# Patient Record
Sex: Female | Born: 1965 | Race: Black or African American | Hispanic: No | Marital: Single | Smoking: Never smoker
Health system: Southern US, Community
[De-identification: ages and names within clinical notes are randomized; demographics above are authoritative.]

## PROBLEM LIST (undated history)

## (undated) HISTORY — PX: HEEL SPUR SURGERY: SHX665

---

## 1998-02-11 ENCOUNTER — Inpatient Hospital Stay (HOSPITAL_COMMUNITY): Admission: AD | Admit: 1998-02-11 | Discharge: 1998-02-12 | Payer: Self-pay | Admitting: Obstetrics and Gynecology

## 1998-02-19 ENCOUNTER — Inpatient Hospital Stay (HOSPITAL_COMMUNITY): Admission: AD | Admit: 1998-02-19 | Discharge: 1998-02-19 | Payer: Self-pay | Admitting: Obstetrics and Gynecology

## 1998-03-05 ENCOUNTER — Observation Stay (HOSPITAL_COMMUNITY): Admission: AD | Admit: 1998-03-05 | Discharge: 1998-03-05 | Payer: Self-pay | Admitting: Obstetrics and Gynecology

## 1998-03-07 ENCOUNTER — Other Ambulatory Visit: Admission: RE | Admit: 1998-03-07 | Discharge: 1998-03-07 | Payer: Self-pay | Admitting: Obstetrics and Gynecology

## 1998-04-17 ENCOUNTER — Inpatient Hospital Stay (HOSPITAL_COMMUNITY): Admission: AD | Admit: 1998-04-17 | Discharge: 1998-04-17 | Payer: Self-pay | Admitting: Obstetrics and Gynecology

## 1998-07-01 ENCOUNTER — Other Ambulatory Visit: Admission: RE | Admit: 1998-07-01 | Discharge: 1998-07-01 | Payer: Self-pay | Admitting: Obstetrics & Gynecology

## 1998-07-29 ENCOUNTER — Inpatient Hospital Stay (HOSPITAL_COMMUNITY): Admission: AD | Admit: 1998-07-29 | Discharge: 1998-07-29 | Payer: Self-pay | Admitting: Obstetrics & Gynecology

## 1998-09-06 ENCOUNTER — Observation Stay (HOSPITAL_COMMUNITY): Admission: AD | Admit: 1998-09-06 | Discharge: 1998-09-07 | Payer: Self-pay | Admitting: Obstetrics & Gynecology

## 1998-09-11 ENCOUNTER — Inpatient Hospital Stay (HOSPITAL_COMMUNITY): Admission: AD | Admit: 1998-09-11 | Discharge: 1998-09-13 | Payer: Self-pay | Admitting: *Deleted

## 1999-10-01 ENCOUNTER — Other Ambulatory Visit: Admission: RE | Admit: 1999-10-01 | Discharge: 1999-10-01 | Payer: Self-pay | Admitting: Obstetrics and Gynecology

## 2000-10-04 ENCOUNTER — Other Ambulatory Visit: Admission: RE | Admit: 2000-10-04 | Discharge: 2000-10-04 | Payer: Self-pay | Admitting: Obstetrics and Gynecology

## 2000-10-20 ENCOUNTER — Encounter (INDEPENDENT_AMBULATORY_CARE_PROVIDER_SITE_OTHER): Payer: Self-pay | Admitting: Specialist

## 2000-10-20 ENCOUNTER — Other Ambulatory Visit: Admission: RE | Admit: 2000-10-20 | Discharge: 2000-10-20 | Payer: Self-pay | Admitting: Plastic Surgery

## 2001-10-03 ENCOUNTER — Other Ambulatory Visit: Admission: RE | Admit: 2001-10-03 | Discharge: 2001-10-03 | Payer: Self-pay | Admitting: Obstetrics and Gynecology

## 2002-10-12 ENCOUNTER — Other Ambulatory Visit: Admission: RE | Admit: 2002-10-12 | Discharge: 2002-10-12 | Payer: Self-pay | Admitting: Obstetrics and Gynecology

## 2003-10-22 ENCOUNTER — Other Ambulatory Visit: Admission: RE | Admit: 2003-10-22 | Discharge: 2003-10-22 | Payer: Self-pay | Admitting: Obstetrics and Gynecology

## 2003-12-08 HISTORY — PX: BREAST ENHANCEMENT SURGERY: SHX7

## 2004-11-06 ENCOUNTER — Other Ambulatory Visit: Admission: RE | Admit: 2004-11-06 | Discharge: 2004-11-06 | Payer: Self-pay | Admitting: Obstetrics and Gynecology

## 2005-12-02 ENCOUNTER — Other Ambulatory Visit: Admission: RE | Admit: 2005-12-02 | Discharge: 2005-12-02 | Payer: Self-pay | Admitting: Obstetrics and Gynecology

## 2010-05-16 ENCOUNTER — Encounter: Admission: RE | Admit: 2010-05-16 | Discharge: 2010-06-18 | Payer: Self-pay | Admitting: Specialist

## 2012-02-22 ENCOUNTER — Ambulatory Visit (INDEPENDENT_AMBULATORY_CARE_PROVIDER_SITE_OTHER): Payer: BC Managed Care – PPO | Admitting: Obstetrics and Gynecology

## 2012-02-22 DIAGNOSIS — Z01419 Encounter for gynecological examination (general) (routine) without abnormal findings: Secondary | ICD-10-CM

## 2012-04-13 ENCOUNTER — Telehealth: Payer: Self-pay | Admitting: Obstetrics and Gynecology

## 2012-04-13 ENCOUNTER — Telehealth: Payer: Self-pay

## 2012-04-13 NOTE — Telephone Encounter (Signed)
Left message for pt to call back regarding Alyacen Tabs Pak 28's. b/c a Rx request fax from [facility]  was received to our for Alyacen. Pt was seen 02-22-12 for an AEX Dr.Stringer wrote a Rx for Necon 1/35 #3 months for 1 year. Just want to confirm whether or not pt has Rx or not.

## 2012-05-03 NOTE — Telephone Encounter (Signed)
Triage/epic 

## 2012-05-20 ENCOUNTER — Telehealth: Payer: Self-pay

## 2012-05-20 NOTE — Telephone Encounter (Signed)
Rx Alyacen 1/35 Tabs Pak 28's 90 day supply 4 Rf's was called in to [facility]  I tried to fax this Rx twice but it would not go through. Rx was called in successfully.

## 2013-02-13 ENCOUNTER — Emergency Department
Admission: EM | Admit: 2013-02-13 | Discharge: 2013-02-13 | Disposition: A | Discharge status: Home / Self Care | Payer: Self-pay | Source: Home / Self Care | Attending: Family Medicine | Admitting: Family Medicine

## 2013-02-13 ENCOUNTER — Encounter: Payer: Self-pay | Admitting: Emergency Medicine

## 2013-02-13 DIAGNOSIS — J029 Acute pharyngitis, unspecified: Secondary | ICD-10-CM

## 2013-02-13 MED ORDER — AZITHROMYCIN 250 MG PO TABS: ORAL_TABLET | ORAL | Status: DC

## 2013-02-13 MED ORDER — BENZONATATE 200 MG PO CAPS: 200.0000 mg | ORAL_CAPSULE | Freq: Every day | ORAL | Status: DC

## 2013-02-13 NOTE — ED Provider Notes (Signed)
History     CSN: [id number]  Arrival date & time 02/13/13  0912   First MD Initiated Contact with Patient 02/13/13 [phone]      Chief Complaint  Patient presents with  . Sore Throat       HPI Comments: Patient complains of two day history of mild sore throat, headache, fatigue, sinus congestion, myalgias, and chills/sweats.  He denies cough.  The history is provided by the patient.    History reviewed. No pertinent past medical history.  History reviewed. No pertinent past surgical history.  Family History  Problem Relation Age of Onset  . Diabetes Mother     History  Substance Use Topics  . Smoking status: Never Smoker   . Smokeless tobacco: Not on file  . Alcohol Use: No    OB History   Grav Para Term Preterm Abortions TAB SAB Ect Mult Living                  Review of Systems + sore throat No cough No pleuritic pain No wheezing + nasal congestion + post-nasal drainage No sinus pain/pressure No itchy/red eyes No earache No hemoptysis No SOB No fever, + chills No nausea No vomiting No abdominal pain No diarrhea No urinary symptoms No skin rashes + fatigue + mild myalgias + headache Used OTC meds without relief  Allergies  Review of patient's allergies indicates no known allergies.  Home Medications   Current Outpatient Rx  Name  Route  Sig  Dispense  Refill  . azithromycin (ZITHROMAX Z-PAK) 250 MG tablet      Take 2 tabs today; then begin one tab once daily for 4 more days. (Rx void after 02/21/13)   6 each   0   . benzonatate (TESSALON) 200 MG capsule   Oral   Take 1 capsule (200 mg total) by mouth at bedtime.   12 capsule   0     BP 109/75  Pulse 85  Temp(Src) 98.2 F (36.8 C) (Oral)  Ht 5\' 6"  (1.676 m)  Wt 179 lb (81.194 kg)  BMI 28.91 kg/m2  SpO2 99%  Physical Exam Nursing notes and Vital Signs reviewed. Appearance:  Patient appears healthy, stated age, and in no acute distress Eyes:  Pupils are equal, round, and  reactive to light and accomodation.  Extraocular movement is intact.  Conjunctivae are not inflamed  Ears:  Canals normal.  Tympanic membranes normal.  Nose:  Mildly congested turbinates.  No sinus tenderness.  Pharynx:  Mildly erythematous Neck:  Supple.  Slightly tender shotty anterior/posterior nodes are palpated bilaterally  Lungs:  Clear to auscultation.  Breath sounds are equal.  Heart:  Regular rate and rhythm without murmurs, rubs, or gallops Chest:   Mild tenderness to palpation over the mid-sternum.  Abdomen:  Nontender without masses or hepatosplenomegaly.  Bowel sounds are present.  No CVA or flank tenderness.  Extremities:  No edema.  No calf tenderness Skin:  No rash present.   ED Course  Procedures  none  Labs Reviewed  STREP A DNA PROBE pending  POCT RAPID STREP A (OFFICE) negative      1. Acute pharyngitis   2. Acute upper respiratory infections of unspecified site; suspect viral URI       MDM  There is no evidence of bacterial infection today.   Throat culture pending. Prescription written for Benzonatate [facility]) to take at bedtime for night-time cough.  Take plain Robitussin (guaifenesin) for congestion and cough.  May add  Sudafed for sinus congestion.  Increase fluid intake, rest. May use Afrin nasal spray (or generic oxymetazoline) twice daily for about 5 days.  Also recommend using saline nasal spray several times daily and saline nasal irrigation (AYR is a common brand) Stop all antihistamines for now, and other non-prescription cough/cold preparations. May take Ibuprofen 200mg , 4 tabs every 8 hours with food for sore throat, chest discomfort, etc. Begin Azithromycin if throat culture positive, if not improving about one week, or if persistent fever develops (Given a prescription to hold, with an expiration date)  Follow-up with family doctor if not improving about10 days.         Lattie Haw, MD 02/13/13 [phone]

## 2013-02-13 NOTE — ED Notes (Signed)
Sore throat, body aches, fever, chills x 3 days

## 2013-02-14 ENCOUNTER — Telehealth: Payer: Self-pay | Admitting: *Deleted

## 2014-11-12 ENCOUNTER — Other Ambulatory Visit: Payer: Self-pay | Admitting: Obstetrics and Gynecology

## 2014-11-15 ENCOUNTER — Encounter (HOSPITAL_BASED_OUTPATIENT_CLINIC_OR_DEPARTMENT_OTHER): Payer: Self-pay | Admitting: *Deleted

## 2014-11-15 NOTE — Progress Notes (Signed)
NPO AFTER MN WITH EXCEPTION CLEAR LIQUIDS UNTIL 0730 (NO CREAM/ MILK PRODUCTS).  ARRIVE AT 1200. NEEDS HG AND URINE PREG.  

## 2014-11-19 NOTE — H&P (Signed)
Admission History and Physical Exam for a Gynecology Patient  [name] is a 48 y.o. female who presents for bilateral salpingectomies via laparoscopy for sterilization. [facility].   History reviewed. No pertinent past medical history.  No prescriptions prior to admission    Past Surgical History  Procedure Laterality Date  . Breast enhancement surgery Bilateral 2005  . Heel spur surgery Bilateral last one  2012 left    w/ bunionectomy    Allergies  Allergen Reactions  . Erythromycin Other (See Comments)    GI UPSET    Family History: family history includes Diabetes in her mother.  Social History:  reports that she has never smoked. She has never used smokeless tobacco. She reports that she does not drink alcohol or use illicit drugs.  Review of systems: See HPI.  Admission Physical Exam:    Body mass index is 28.19 kg/(m^2).  Height 5\' 7"  (1.702 m), weight 180 lb (81.647 kg), last menstrual period 11/07/2014.  HEENT:                 Within normal limits Chest:                   Clear Heart:                    Regular rate and rhythm Breasts:                No masses, skin changes, bleeding, or discharge present Abdomen:             Nontender, no masses Extremities:          Grossly normal Neurologic exam: Grossly normal  Pelvic exam:  External genitalia: normal general appearance Vaginal: normal without tenderness, induration or masses Cervix: normal appearance Adnexa: normal bimanual exam Uterus: normal size shape and consistency Rectal: no masses  Assessment:  Desires sterilization  Plan:  The patient will undergo laparoscopic bilateral salpingectomies for sterilization.  She understands the indications for her surgical procedure as well as the alternative treatment options.  She accepts the risk of, but not limited to, anesthetic application,  bleeding, infections, and possible damage to the surrounding organs.   Megan Villegas,Megan Villegas 11/19/2014

## 2014-11-20 ENCOUNTER — Ambulatory Visit (HOSPITAL_BASED_OUTPATIENT_CLINIC_OR_DEPARTMENT_OTHER): Payer: BC Managed Care – PPO | Admitting: Anesthesiology

## 2014-11-20 ENCOUNTER — Ambulatory Visit (HOSPITAL_COMMUNITY)
Admission: RE | Admit: 2014-11-20 | Payer: BC Managed Care – PPO | Source: Ambulatory Visit | Admitting: Obstetrics and Gynecology

## 2014-11-20 ENCOUNTER — Encounter (HOSPITAL_BASED_OUTPATIENT_CLINIC_OR_DEPARTMENT_OTHER): Payer: Self-pay | Admitting: *Deleted

## 2014-11-20 ENCOUNTER — Ambulatory Visit (HOSPITAL_BASED_OUTPATIENT_CLINIC_OR_DEPARTMENT_OTHER)
Admission: RE | Admit: 2014-11-20 | Discharge: 2014-11-20 | Disposition: A | Discharge status: Home / Self Care | Payer: BC Managed Care – PPO | Source: Ambulatory Visit | Attending: Obstetrics and Gynecology | Admitting: Obstetrics and Gynecology

## 2014-11-20 ENCOUNTER — Encounter (HOSPITAL_BASED_OUTPATIENT_CLINIC_OR_DEPARTMENT_OTHER)
Admission: RE | Disposition: A | Discharge status: Home / Self Care | Payer: Self-pay | Source: Ambulatory Visit | Attending: Obstetrics and Gynecology

## 2014-11-20 ENCOUNTER — Encounter (HOSPITAL_COMMUNITY): Admission: RE | Payer: Self-pay | Source: Ambulatory Visit

## 2014-11-20 DIAGNOSIS — Z6828 Body mass index (BMI) 28.0-28.9, adult: Secondary | ICD-10-CM | POA: Diagnosis not present

## 2014-11-20 DIAGNOSIS — Z302 Encounter for sterilization: Secondary | ICD-10-CM | POA: Insufficient documentation

## 2014-11-20 DIAGNOSIS — D649 Anemia, unspecified: Secondary | ICD-10-CM | POA: Diagnosis not present

## 2014-11-20 DIAGNOSIS — E669 Obesity, unspecified: Secondary | ICD-10-CM | POA: Diagnosis not present

## 2014-11-20 HISTORY — PX: LAPAROSCOPIC BILATERAL SALPINGECTOMY: SHX5889

## 2014-11-20 LAB — POCT HEMOGLOBIN-HEMACUE: Hemoglobin: 10.6 g/dL — ABNORMAL LOW (ref 12.0–15.0)

## 2014-11-20 LAB — POCT PREGNANCY, URINE: Preg Test, Ur: NEGATIVE

## 2014-11-20 SURGERY — SALPINGECTOMY, BILATERAL, LAPAROSCOPIC
Anesthesia: General | Site: Abdomen | Laterality: Bilateral

## 2014-11-20 SURGERY — SALPINGECTOMY, BILATERAL, LAPAROSCOPIC
Anesthesia: General | Laterality: Bilateral

## 2014-11-20 MED ORDER — MIDAZOLAM HCL 5 MG/5ML IJ SOLN
INTRAMUSCULAR | Status: DC | PRN
  Administered 2014-11-20: 2 mg via INTRAVENOUS

## 2014-11-20 MED ORDER — IBUPROFEN 800 MG PO TABS: 800.0000 mg | ORAL_TABLET | Freq: Three times a day (TID) | ORAL | Status: AC | PRN

## 2014-11-20 MED ORDER — FENTANYL CITRATE 0.05 MG/ML IJ SOLN
INTRAMUSCULAR | Status: DC | PRN
  Administered 2014-11-20 (×2): 50 ug via INTRAVENOUS

## 2014-11-20 MED ORDER — ROCURONIUM BROMIDE 100 MG/10ML IV SOLN
INTRAVENOUS | Status: DC | PRN
  Administered 2014-11-20: 30 mg via INTRAVENOUS

## 2014-11-20 MED ORDER — BUPIVACAINE-EPINEPHRINE 0.5% -1:200000 IJ SOLN
INTRAMUSCULAR | Status: DC | PRN
  Administered 2014-11-20: 7 mL

## 2014-11-20 MED ORDER — PROPOFOL 10 MG/ML IV BOLUS
INTRAVENOUS | Status: DC | PRN
  Administered 2014-11-20: 150 mg via INTRAVENOUS
  Administered 2014-11-20: 50 mg via INTRAVENOUS

## 2014-11-20 MED ORDER — DEXAMETHASONE SODIUM PHOSPHATE 4 MG/ML IJ SOLN
INTRAMUSCULAR | Status: DC | PRN
  Administered 2014-11-20: 10 mg via INTRAVENOUS

## 2014-11-20 MED ORDER — FENTANYL CITRATE 0.05 MG/ML IJ SOLN
INTRAMUSCULAR | Status: AC
  Filled 2014-11-20: qty 4

## 2014-11-20 MED ORDER — ONDANSETRON HCL 4 MG/2ML IJ SOLN
INTRAMUSCULAR | Status: DC | PRN
  Administered 2014-11-20: 4 mg via INTRAVENOUS

## 2014-11-20 MED ORDER — OXYCODONE-ACETAMINOPHEN 5-325 MG PO TABS: 1.0000 | ORAL_TABLET | ORAL | Status: AC | PRN

## 2014-11-20 MED ORDER — OXYCODONE-ACETAMINOPHEN 5-325 MG PO TABS: 1.0000 | ORAL_TABLET | ORAL | Status: DC | PRN

## 2014-11-20 MED ORDER — KETOROLAC TROMETHAMINE 30 MG/ML IJ SOLN
INTRAMUSCULAR | Status: DC | PRN
  Administered 2014-11-20: 30 mg via INTRAVENOUS

## 2014-11-20 MED ORDER — GLYCOPYRROLATE 0.2 MG/ML IJ SOLN
INTRAMUSCULAR | Status: DC | PRN
  Administered 2014-11-20: 0.4 mg via INTRAVENOUS

## 2014-11-20 MED ORDER — LIDOCAINE HCL (CARDIAC) 20 MG/ML IV SOLN
INTRAVENOUS | Status: DC | PRN
  Administered 2014-11-20: 70 mg via INTRAVENOUS

## 2014-11-20 MED ORDER — LACTATED RINGERS IV SOLN
INTRAVENOUS | Status: DC
  Filled 2014-11-20: qty 1000

## 2014-11-20 MED ORDER — PROMETHAZINE HCL 12.5 MG PO TABS: 12.5000 mg | ORAL_TABLET | Freq: Four times a day (QID) | ORAL | Status: DC | PRN

## 2014-11-20 MED ORDER — MIDAZOLAM HCL 2 MG/2ML IJ SOLN
INTRAMUSCULAR | Status: AC
  Filled 2014-11-20: qty 2

## 2014-11-20 MED ORDER — PROMETHAZINE HCL 12.5 MG PO TABS: 12.5000 mg | ORAL_TABLET | Freq: Four times a day (QID) | ORAL | Status: AC | PRN

## 2014-11-20 MED ORDER — ACETAMINOPHEN 10 MG/ML IV SOLN
INTRAVENOUS | Status: DC | PRN
  Administered 2014-11-20: 1000 mg via INTRAVENOUS

## 2014-11-20 MED ORDER — IBUPROFEN 800 MG PO TABS: 800.0000 mg | ORAL_TABLET | Freq: Three times a day (TID) | ORAL | Status: DC | PRN

## 2014-11-20 MED ORDER — NEOSTIGMINE METHYLSULFATE 10 MG/10ML IV SOLN
INTRAVENOUS | Status: DC | PRN
  Administered 2014-11-20: 3 mg via INTRAVENOUS

## 2014-11-20 MED ORDER — SODIUM CHLORIDE 0.9 % IR SOLN
Status: DC | PRN
  Administered 2014-11-20: 500 mL

## 2014-11-20 MED ORDER — LACTATED RINGERS IV SOLN
INTRAVENOUS | Status: DC
  Administered 2014-11-20: 13:00:00 via INTRAVENOUS
  Filled 2014-11-20: qty 1000

## 2014-11-20 MED ORDER — FENTANYL CITRATE 0.05 MG/ML IJ SOLN
25.0000 ug | INTRAMUSCULAR | Status: DC | PRN
  Filled 2014-11-20: qty 1

## 2014-11-20 SURGICAL SUPPLY — 41 items
BAG URINE DRAINAGE (UROLOGICAL SUPPLIES) ×3 IMPLANT
BLADE SURG 11 STRL SS (BLADE) ×3 IMPLANT
CATH FOLEY 2WAY SLVR  5CC 14FR (CATHETERS) ×2
CATH FOLEY 2WAY SLVR 5CC 14FR (CATHETERS) ×1 IMPLANT
CHLORAPREP W/TINT 26ML (MISCELLANEOUS) ×3 IMPLANT
CLOSURE WOUND 1/2 X4 (GAUZE/BANDAGES/DRESSINGS) ×1
CLOSURE WOUND 1/4 X3 (GAUZE/BANDAGES/DRESSINGS)
COVER MAYO STAND STRL (DRAPES) ×3 IMPLANT
COVER TABLE BACK 60X90 (DRAPES) ×3 IMPLANT
DRAPE UNDERBUTTOCKS STRL (DRAPE) ×3 IMPLANT
DRSG COVADERM PLUS 2X2 (GAUZE/BANDAGES/DRESSINGS) IMPLANT
DRSG OPSITE POSTOP 3X4 (GAUZE/BANDAGES/DRESSINGS) IMPLANT
FORCEPS CUTTING 33CM 5MM (CUTTING FORCEPS) IMPLANT
FORCEPS CUTTING 45CM 5MM (CUTTING FORCEPS) IMPLANT
GLOVE BIOGEL PI IND STRL 8.5 (GLOVE) ×1 IMPLANT
GLOVE BIOGEL PI INDICATOR 8.5 (GLOVE) ×2
GLOVE ECLIPSE 8.0 STRL XLNG CF (GLOVE) ×6 IMPLANT
GOWN STRL REUS W/TWL 2XL LVL3 (GOWN DISPOSABLE) ×3 IMPLANT
GOWN STRL REUS W/TWL LRG LVL3 (GOWN DISPOSABLE) ×3 IMPLANT
HOLDER FOLEY CATH W/STRAP (MISCELLANEOUS) IMPLANT
NEEDLE HYPO 25X1 1.5 SAFETY (NEEDLE) ×3 IMPLANT
NS IRRIG 500ML POUR BTL (IV SOLUTION) ×3 IMPLANT
PACK BASIN DAY SURGERY FS (CUSTOM PROCEDURE TRAY) ×3 IMPLANT
PACK LAPAROSCOPY II (CUSTOM PROCEDURE TRAY) ×3 IMPLANT
PAD TRENDELENBURG OR TABLE (MISCELLANEOUS) IMPLANT
POUCH SPECIMEN RETRIEVAL 10MM (ENDOMECHANICALS) IMPLANT
RINGERS IRRIG 1000ML POUR BTL (IV SOLUTION) IMPLANT
SET IRRIG TUBING LAPAROSCOPIC (IRRIGATION / IRRIGATOR) ×3 IMPLANT
SHEARS HARMONIC ACE PLUS 36CM (ENDOMECHANICALS) IMPLANT
STRIP CLOSURE SKIN 1/2X4 (GAUZE/BANDAGES/DRESSINGS) ×2 IMPLANT
STRIP CLOSURE SKIN 1/4X3 (GAUZE/BANDAGES/DRESSINGS) IMPLANT
SUT MNCRL AB 3-0 PS2 27 (SUTURE) ×3 IMPLANT
SUT VIC AB 2-0 UR6 27 (SUTURE) ×6 IMPLANT
SUT VICRYL 0 ENDOLOOP (SUTURE) IMPLANT
SYR 50ML LL SCALE MARK (SYRINGE) IMPLANT
SYR CONTROL 10ML LL (SYRINGE) ×3 IMPLANT
TOWEL OR 17X24 6PK STRL BLUE (TOWEL DISPOSABLE) ×6 IMPLANT
TRAY DSU PREP LF (CUSTOM PROCEDURE TRAY) ×3 IMPLANT
TUBING INSUFFLATION W/FILTER (TUBING) ×3 IMPLANT
WARMER LAPAROSCOPE (MISCELLANEOUS) ×3 IMPLANT
WATER STERILE IRR 1000ML POUR (IV SOLUTION) IMPLANT

## 2014-11-20 NOTE — Op Note (Signed)
OPERATIVE NOTE  Megan Villegas  DOB:    13-May-1966  MRN:    [id number]  CSN:    [id number]  Date of Surgery:  11/20/2014  Preoperative Diagnosis:  Desires sterilization Obesity Anemia   Postoperative Diagnosis:  Desires sterilization Obesity Anemia    Procedure:  Tubal sterilization procedure via bilateral salpingectomy  Surgeon:  Leonard SchwartzArthur Vernon Delle Andrzejewski, M.D.  Assistant:  None  Anesthetic:  General  Disposition:  Megan Villegas is a 48 y.o. year old female,No obstetric history on file., who presents for a tubal sterilization procedure. She understands the indications for her operation as well as the alternative treatment options. She accepts the risk of, but not limited to, anesthetic complications, bleeding, infections, possible damage to the surrounding organs, and possible tubal failure (17 per 1000). The patient elects bilateral salpingectomy because of the potential reduction of cancer risk.  Findings:  The uterus, fallopian tubes, and ovaries were normal. The bowel and the liver appeared normal.  Procedure:  The patient was taken to the operating room where a general anesthetic was given. The patient's abdomen, perineum, and vagina were prepped with sterile solution. The bladder was drained of urine. An examination under anesthesia was performed. A Hulka tenaculum was placed inside the uterus. The patient was sterilely draped. The subumbilical area was injected with half percent Marcaine with epinephrine. A subumbilical incision was made and the Veress needle was inserted into the abdominal cavity without difficulty. Proper placement was confirmed using the fluid drop test. A pneumoperitoneum was obtained. The laparoscopic trocar and the laparoscope were substituted for the Veress needle. The pelvic organs were inspected with findings as mentioned above. Half percent Marcaine with epinephrine was injected in the left lower quadrant. A 5 mm trocar was placed  and the abdominal cavity under direct visualization. The right fallopian tube was identified and followed to the fimbriated end. The mesosalpinx of the right fallopian tube was cauterized until it was completely desiccated. The right fallopian tube was then cauterized approximately 1 cm from the uterus. The right fallopian tube was excised. Hemostasis was adequate. An identical procedure was carried out on the opposite side. Again hemostasis was adequate. The fallopian tubes were removed through the umbilical port. The pneumoperitoneum was allowed to escape. All instruments were removed. The subumbilical fascia was closed using 0 Vicryl. The skin was reapproximated using 3-0 Monocryl. Sponge and needle counts were correct. The estimated blood loss was less than 10 cc. The patient tolerated her procedure well. She was awakened from her anesthetic without difficulty. She was transported to the recovery room in stable condition. The fallopian tubes were sent to pathology.   Followup instructions:  The patient will return to see Dr. Stefano GaulStringer in 2 weeks. She was given a copy of the postoperative instructions as [facility] by [facility] for patients who've undergone laparoscopy.  Discharge medications:  Motrin 800 mg tablets            One tablet every 8 hours as needed for moderate pain. Percocet  1-2 tablets             One or two tablets every 4 hours as needed for severe pain. Phenergan 12.5 mg tablets   One tablet every 6 hours as needed for nausea.  Leonard SchwartzArthur Vernon Amory Simonetti, M.D.

## 2014-11-20 NOTE — H&P (Signed)
The patient was interviewed and examined today.  The previously documented history and physical examination was reviewed. There are no changes. The operative procedure was reviewed. The risks and benefits were outlined again. The specific risks include, but are not limited to, anesthetic complications, bleeding, infections, and possible damage to the surrounding organs. The patient's questions were answered.  We are ready to proceed as outlined. The likelihood of the patient achieving the goals of this procedure is very likely.  BP 127/75 mmHg  Pulse 81  Temp(Src) 98.9 F (37.2 C) (Oral)  Resp 16  Ht 5\' 7"  (1.702 m)  Wt 180 lb (81.647 kg)  BMI 28.19 kg/m2  SpO2 100%  LMP 11/07/2014 (Exact Date)  Results for orders placed or performed during the hospital encounter of 11/20/14 (from the past 24 hour(s))  Pregnancy, urine POC     Status: None   Collection Time: 11/20/14 12:08 PM  Result Value Ref Range   Preg Test, Ur NEGATIVE NEGATIVE  Hemoglobin-hemacue, POC     Status: Abnormal   Collection Time: 11/20/14 12:45 PM  Result Value Ref Range   Hemoglobin 10.6 (L) 12.0 - 15.0 g/dL      Leonard SchwartzArthur Vernon Trentan Trippe, M.D.

## 2014-11-20 NOTE — Anesthesia Preprocedure Evaluation (Addendum)
Anesthesia Evaluation  Patient identified by MRN, date of birth, ID band Patient awake    Reviewed: Allergy & Precautions, H&P , NPO status , Patient's Chart, lab work & pertinent test results  Airway Mallampati: II  TM Distance: >3 FB Neck ROM: full    Dental no notable dental hx. (+) Teeth Intact, Dental Advisory Given   Pulmonary neg pulmonary ROS,  breath sounds clear to auscultation  Pulmonary exam normal       Cardiovascular Exercise Tolerance: Good negative cardio ROS  Rhythm:regular Rate:Normal     Neuro/Psych negative neurological ROS  negative psych ROS   GI/Hepatic negative GI ROS, Neg liver ROS,   Endo/Other  negative endocrine ROS  Renal/GU negative Renal ROS  negative genitourinary   Musculoskeletal   Abdominal   Peds  Hematology negative hematology ROS (+)   Anesthesia Other Findings   Reproductive/Obstetrics negative OB ROS                            Anesthesia Physical Anesthesia Plan  ASA: I  Anesthesia Plan: General   Post-op Pain Management:    Induction: Intravenous  Airway Management Planned: Oral ETT  Additional Equipment:   Intra-op Plan:   Post-operative Plan: Extubation in OR  Informed Consent: I have reviewed the patients History and Physical, chart, labs and discussed the procedure including the risks, benefits and alternatives for the proposed anesthesia with the patient or authorized representative who has indicated his/her understanding and acceptance.   Dental Advisory Given  Plan Discussed with: CRNA and Surgeon  Anesthesia Plan Comments:         Anesthesia Quick Evaluation  

## 2014-11-20 NOTE — Transfer of Care (Signed)
Immediate Anesthesia Transfer of Care Note  Patient: Megan Villegas  Procedure(s) Performed: Procedure(s): LAPAROSCOPIC BILATERAL SALPINGECTOMY (Bilateral)  Patient Location: PACU  Anesthesia Type:General  Level of Consciousness: awake, alert , oriented and patient cooperative  Airway & Oxygen Therapy: Patient Spontanous Breathing and Patient connected to nasal cannula oxygen  Post-op Assessment: Report given to PACU RN and Post -op Vital signs reviewed and stable  Post vital signs: Reviewed and stable  Complications: No apparent anesthesia complications

## 2014-11-20 NOTE — Discharge Instructions (Signed)
HOME CARE INSTRUCTIONS - LAPAROSCOPY ° °Wound Care: The bandaids or dressing which are placed over the skin openings may be removed the day after surgery. The incision should be kept clean and dry. The stitches do not need to be removed. Should the incision become sore, red, and swollen after the first week, check with your doctor. ° °Personal Hygiene: Shower the day after your procedure. Always wipe from front to back after elimination.  ° °Activity: Do not drive or operate any equipment today. The effects of the anesthesia are still present and drowsiness may result. Rest today, not necessarily flat bed rest, just take it easy. You may resume your normal activity in one to three days or as instructed by your physician. ° °Sexual Activity: You resume sexual activity as indicated by your physician_________. °If your laparoscopy was for a sterilization ( tubes tied ), continue current method of birth control until after your next period or ask for specific instructions from your doctor. ° °Diet: Eat a light diet as desired this evening. You may resume a regular diet tomorrow. ° °Return to Work: Two to three days or as indicated by your doctor. ° °Expectations °After Surgery: Your surgery will cause vaginal drainage or spotting which may continue for 2-3 days. Mild abdominal discomfort or tenderness is not unusual and some shoulder pain may also be noted which can be relieved by lying flat in pain. ° °Call Your Doctor °If these Occur:  Persistent or heavy bleeding at incision site °      Redness or swelling around incision °      Elevation of temperature greater than 100 degrees F ° °Call for follow-up appointment  ° ° °Post Anesthesia Home Care Instructions ° °Activity: °Get plenty of rest for the remainder of the day. A responsible adult should stay with you for 24 hours following the procedure.  °For the next 24 hours, DO NOT: °-Drive a car °-Operate machinery °-Drink alcoholic beverages °-Take any medication  unless instructed by your physician °-Make any legal decisions or sign important papers. ° °Meals: °Start with liquid foods such as gelatin or soup. Progress to regular foods as tolerated. Avoid greasy, spicy, heavy foods. If nausea and/or vomiting occur, drink only clear liquids until the nausea and/or vomiting subsides. Call your physician if vomiting continues. ° °Special Instructions/Symptoms: °Your throat may feel dry or sore from the anesthesia or the breathing tube placed in your throat during surgery. If this causes discomfort, gargle with warm salt water. The discomfort should disappear within 24 hours. ° °

## 2014-11-20 NOTE — Anesthesia Postprocedure Evaluation (Signed)
Anesthesia Post Note  Patient: Megan Villegas  Procedure(s) Performed: Procedure(s) (LRB): LAPAROSCOPIC BILATERAL SALPINGECTOMY (Bilateral)  Anesthesia type: General  Patient location: PACU  Post pain: Pain level controlled  Post assessment: Post-op Vital signs reviewed  Last Vitals: BP 108/65 mmHg  Pulse 66  Temp(Src) 36.5 C (Oral)  Resp 16  Ht 5\' 7"  (1.702 m)  Wt 180 lb (81.647 kg)  BMI 28.19 kg/m2  SpO2 99%  LMP 11/07/2014 (Exact Date)  Post vital signs: Reviewed  Level of consciousness: sedated  Complications: No apparent anesthesia complications

## 2014-11-20 NOTE — Anesthesia Procedure Notes (Signed)
Procedure Name: Intubation Date/Time: 11/20/2014 1:43 PM Performed by: Tyrone NineSAUVE, Kyri Shader F Pre-anesthesia Checklist: Patient identified, Timeout performed, Emergency Drugs available, Suction available and Patient being monitored Patient Re-evaluated:Patient Re-evaluated prior to inductionOxygen Delivery Method: Circle system utilized Preoxygenation: Pre-oxygenation with 100% oxygen Intubation Type: IV induction Ventilation: Mask ventilation without difficulty Laryngoscope Size: Mac and 3 Grade View: Grade II Tube type: Oral Tube size: 7.0 mm Number of attempts: 1 Airway Equipment and Method: Stylet and Bite block Placement Confirmation: ETT inserted through vocal cords under direct vision Secured at: 20 cm Tube secured with: Tape Dental Injury: Teeth and Oropharynx as per pre-operative assessment

## 2014-11-21 ENCOUNTER — Encounter (HOSPITAL_BASED_OUTPATIENT_CLINIC_OR_DEPARTMENT_OTHER): Payer: Self-pay | Admitting: Obstetrics and Gynecology

## 2014-11-23 NOTE — Addendum Note (Signed)
Addendum  created 11/23/14 1418 by Gaetano Hawthorneharles L Jazzmyn Filion, MD   Modules edited: Anesthesia Responsible Staff

## 2018-10-04 ENCOUNTER — Other Ambulatory Visit: Payer: Self-pay | Admitting: Obstetrics and Gynecology

## 2020-02-17 ENCOUNTER — Ambulatory Visit: Payer: Self-pay | Attending: Internal Medicine

## 2020-02-17 DIAGNOSIS — Z23 Encounter for immunization: Secondary | ICD-10-CM

## 2020-02-17 NOTE — Progress Notes (Signed)
   Covid-19 Vaccination Clinic  Name:  TAJANAY HURLEY    MRN: [id number] DOB: Jun 14, 1966  02/17/2020  Ms. Kessen was observed post Covid-19 immunization for 15 minutes without incident. She was provided with Vaccine Information Sheet and instruction to access the V-Safe system.   Ms. Johannsen was instructed to call 911 with any severe reactions post vaccine: Marland Kitchen Difficulty breathing  . Swelling of face and throat  . A fast heartbeat  . A bad rash all over body  . Dizziness and weakness   Immunizations Administered    Name Date Dose VIS Date Route   Pfizer COVID-19 Vaccine 02/17/2020  3:22 PM 0.3 mL 11/17/2019 Intramuscular   Manufacturer: [employer], [employer]   Lot: [id number]   NDC: [address]-1

## 2020-03-12 ENCOUNTER — Ambulatory Visit: Payer: Self-pay

## 2020-03-12 ENCOUNTER — Ambulatory Visit: Payer: Self-pay | Attending: Internal Medicine

## 2020-03-12 DIAGNOSIS — Z23 Encounter for immunization: Secondary | ICD-10-CM

## 2020-03-12 NOTE — Progress Notes (Signed)
   Covid-19 Vaccination Clinic  Name:  Megan Villegas    MRN: [id number] DOB: 1966/03/05  03/12/2020  Megan Villegas was observed post Covid-19 immunization for 15 minutes without incident. She was provided with Vaccine Information Sheet and instruction to access the V-Safe system.   Megan Villegas was instructed to call 911 with any severe reactions post vaccine: Marland Kitchen Difficulty breathing  . Swelling of face and throat  . A fast heartbeat  . A bad rash all over body  . Dizziness and weakness   Immunizations Administered    Name Date Dose VIS Date Route   Pfizer COVID-19 Vaccine 03/12/2020 12:56 PM 0.3 mL 11/17/2019 Intramuscular   Manufacturer: [employer], [employer]   Lot: [id number]   NDC: [address]-1

## 2020-10-09 ENCOUNTER — Ambulatory Visit (INDEPENDENT_AMBULATORY_CARE_PROVIDER_SITE_OTHER): Payer: No Typology Code available for payment source

## 2020-10-09 ENCOUNTER — Other Ambulatory Visit: Payer: Self-pay

## 2020-10-09 ENCOUNTER — Encounter: Payer: Self-pay | Admitting: Podiatry

## 2020-10-09 ENCOUNTER — Ambulatory Visit: Payer: No Typology Code available for payment source | Admitting: Podiatry

## 2020-10-09 DIAGNOSIS — M76822 Posterior tibial tendinitis, left leg: Secondary | ICD-10-CM

## 2020-10-09 DIAGNOSIS — M779 Enthesopathy, unspecified: Secondary | ICD-10-CM | POA: Diagnosis not present

## 2020-10-09 DIAGNOSIS — M79672 Pain in left foot: Secondary | ICD-10-CM

## 2020-10-09 MED ORDER — DICLOFENAC SODIUM 75 MG PO TBEC: 75.0000 mg | DELAYED_RELEASE_TABLET | Freq: Two times a day (BID) | ORAL | 2 refills | Status: AC

## 2020-10-09 NOTE — Progress Notes (Signed)
Subjective:   Patient ID: Megan Villegas, female   DOB: 54 y.o.   MRN: [id number]   HPI Patient presents stating she has a lot of pain on the inside of her left ankle and she had started with some heel pain and that it gradually moved here and also into her big toe where she has had previous bunion surgery.  States that it is been sore and she is tried different activities without relief and patient does not smoke likes to be active   Review of Systems  All other systems reviewed and are negative.       Objective:  Physical Exam Vitals and nursing note reviewed.  Constitutional:      Appearance: She is well-developed.  Pulmonary:     Effort: Pulmonary effort is normal.  Musculoskeletal:        General: Normal range of motion.  Skin:    General: Skin is warm.  Neurological:     Mental Status: She is alert.     Neurovascular status intact muscle strength found to be adequate range of motion was adequate and I checked strength and muscle and found posterior tib in adequate position.  Moderate depression of the arch noted and patient is splinting when I try to move around the joint surface.  There is inflammation along posterior tibial tendon as it comes under medial malleolus and inserts into the navicular and there is quite a bit of soreness also in the posterior compartment and into the big toe joint which is probably compensatory     Assessment:  Significant posterior tibial tendinitis left with inflammation with compensatory pain     Plan:  H&P x-rays reviewed and since were working on so many areas I went ahead today and I am going to immobilize completely with air fracture walker.  At this point I did go ahead first and I did sterile prep and injected the sheath of the posterior tibial tendon 3 mg Kenalog 5 mg Xylocaine placed on diclofenac 75 mg twice daily continue that immobilization and patient will be seen back to recheck  X-rays indicate no signs of fracture with  moderate depression of the arch and no other pathology noted

## 2020-10-22 ENCOUNTER — Other Ambulatory Visit: Payer: Self-pay | Admitting: Podiatry

## 2020-10-22 DIAGNOSIS — M76822 Posterior tibial tendinitis, left leg: Secondary | ICD-10-CM

## 2020-10-30 ENCOUNTER — Other Ambulatory Visit: Payer: Self-pay

## 2020-10-30 ENCOUNTER — Encounter: Payer: Self-pay | Admitting: Podiatry

## 2020-10-30 ENCOUNTER — Ambulatory Visit: Payer: No Typology Code available for payment source | Admitting: Podiatry

## 2020-10-30 DIAGNOSIS — M779 Enthesopathy, unspecified: Secondary | ICD-10-CM

## 2020-10-30 DIAGNOSIS — M76822 Posterior tibial tendinitis, left leg: Secondary | ICD-10-CM | POA: Diagnosis not present

## 2020-10-31 NOTE — Progress Notes (Signed)
Subjective:   Patient ID: Megan Villegas, female   DOB: 54 y.o.   MRN: [id number]   HPI Patient states that she is improving still having discomfort but only upon active movement   ROS      Objective:  Physical Exam  Neurovascular status intact with improvement of her discomfort that she has been experiencing with pain still present only upon deep palpation     Assessment:  Posterior tibial tendinitis left that has improved still mildly painful     Plan:  Reviewed exercises supportive shoes anti-inflammatories and continue brace usage.  Patient is discharged but will be seen back on an as-needed basis and hopefully this will be the end of the symptomatology

## 2021-01-15 ENCOUNTER — Other Ambulatory Visit: Payer: Self-pay

## 2021-01-15 ENCOUNTER — Encounter: Payer: Self-pay | Admitting: Podiatry

## 2021-01-15 ENCOUNTER — Ambulatory Visit: Payer: No Typology Code available for payment source | Admitting: Podiatry

## 2021-01-15 DIAGNOSIS — M76822 Posterior tibial tendinitis, left leg: Secondary | ICD-10-CM | POA: Diagnosis not present

## 2021-01-15 DIAGNOSIS — T148XXA Other injury of unspecified body region, initial encounter: Secondary | ICD-10-CM

## 2021-01-15 NOTE — Progress Notes (Signed)
Subjective:   Patient ID: Megan Villegas, female   DOB: 55 y.o.   MRN: [id number]   HPI Patient states she only had a couple weeks relief and has had an increase in intensification of pain in the tendon of her left foot.  States she is been trying to wear the boot and that has not been successful   ROS      Objective:  Physical Exam  Neurovascular status intact with patient found to have quite a bit of pain posterior tibial tendon as it comes under the medial malleolus left with moderate depression of the arch mild swelling of the tendon     Assessment:  Possibility for interstitial tear of the posterior tibial tendon versus a inflamed tendon secondary to foot structure     Plan:  Due to the failure to respond conservatively and especially immobilization I am sending her for MRI to understand whether or not there could be a tear of the tendon.  Patient will be seen back by me when we get results and we will make a decision about surgical intervention in this case versus orthotics physical therapy

## 2021-01-19 ENCOUNTER — Ambulatory Visit
Admission: RE | Admit: 2021-01-19 | Discharge: 2021-01-19 | Disposition: A | Discharge status: Home / Self Care | Payer: No Typology Code available for payment source | Source: Ambulatory Visit | Attending: Podiatry | Admitting: Podiatry

## 2021-01-19 DIAGNOSIS — T148XXA Other injury of unspecified body region, initial encounter: Secondary | ICD-10-CM

## 2021-01-21 ENCOUNTER — Other Ambulatory Visit: Payer: Self-pay

## 2021-01-30 ENCOUNTER — Ambulatory Visit: Payer: No Typology Code available for payment source | Admitting: Podiatry

## 2021-02-03 ENCOUNTER — Ambulatory Visit (INDEPENDENT_AMBULATORY_CARE_PROVIDER_SITE_OTHER): Payer: No Typology Code available for payment source

## 2021-02-03 ENCOUNTER — Other Ambulatory Visit: Payer: Self-pay

## 2021-02-03 ENCOUNTER — Encounter: Payer: Self-pay | Admitting: Podiatry

## 2021-02-03 ENCOUNTER — Ambulatory Visit: Payer: No Typology Code available for payment source | Admitting: Podiatry

## 2021-02-03 DIAGNOSIS — M216X2 Other acquired deformities of left foot: Secondary | ICD-10-CM | POA: Diagnosis not present

## 2021-02-03 DIAGNOSIS — M2142 Flat foot [pes planus] (acquired), left foot: Secondary | ICD-10-CM

## 2021-02-03 DIAGNOSIS — M25572 Pain in left ankle and joints of left foot: Secondary | ICD-10-CM | POA: Diagnosis not present

## 2021-02-03 DIAGNOSIS — M2141 Flat foot [pes planus] (acquired), right foot: Secondary | ICD-10-CM | POA: Diagnosis not present

## 2021-02-03 DIAGNOSIS — M76829 Posterior tibial tendinitis, unspecified leg: Secondary | ICD-10-CM

## 2021-02-03 DIAGNOSIS — M958 Other specified acquired deformities of musculoskeletal system: Secondary | ICD-10-CM | POA: Diagnosis not present

## 2021-02-03 DIAGNOSIS — S93422A Sprain of deltoid ligament of left ankle, initial encounter: Secondary | ICD-10-CM

## 2021-02-03 DIAGNOSIS — M76822 Posterior tibial tendinitis, left leg: Secondary | ICD-10-CM

## 2021-02-03 DIAGNOSIS — S93692A Other sprain of left foot, initial encounter: Secondary | ICD-10-CM

## 2021-02-03 DIAGNOSIS — M21862 Other specified acquired deformities of left lower leg: Secondary | ICD-10-CM

## 2021-02-03 MED ORDER — DEXAMETHASONE SODIUM PHOSPHATE 4 MG/ML IJ SOLN: 8.0000 mg | Freq: Once | INTRAMUSCULAR | Status: AC

## 2021-02-03 MED ORDER — TRIAMCINOLONE ACETONIDE 40 MG/ML IJ SUSP: 20.0000 mg | Freq: Once | INTRAMUSCULAR | Status: AC

## 2021-02-03 NOTE — Progress Notes (Signed)
Subjective:  Patient ID: Megan Villegas, female    DOB: 1966-03-18,  MRN: [id number]  Chief Complaint  Patient presents with  . Foot Pain    MRI results of the left foot     55 y.o. female presents with the above complaint. History confirmed with patient.  She is referred to me for severe adult flatfoot disease as well as a torn posterior tibial tendon by Dr. Paulla Villegas.  She recently completed an MRI.  She presents today in a CAM boot.  She has not done any physical therapy.  This is been going on but is getting progressively worse over the past several years.  Most of the pain is on the inside of the ankle, she does have some pain on the anterolateral ankle as well.  Objective:  Physical Exam: warm, good capillary refill, no trophic changes or ulcerative lesions, normal DP and PT pulses and normal sensory exam. Left Foot: She has severe pain over the posterior tibial tendon, edema over this area, she is unable to do a single or double heel rises.  She has pes planovalgus.  Prominent navicular tuberosity.  Pain in the sinus tarsi as well as the anterolateral ankle joint with dorsiflexion.   Radiographs: X-ray of the left foot: New oblique and calcaneal axial views taken she has hindfoot valgus  Study Result  Narrative & Impression  CLINICAL DATA:  Medial left ankle pain for 3 months  EXAM: MRI OF THE LEFT ANKLE WITHOUT CONTRAST  TECHNIQUE: Multiplanar, multisequence MR imaging of the ankle was performed. No intravenous contrast was administered.  COMPARISON:  Radiographs 10/09/2020  FINDINGS: TENDONS  Peroneal: Mild common peroneal tendon sheath tenosynovitis. Mild peroneus longus tenosynovitis adjacent to the cuboid.  Posteromedial: Substantial tibialis posterior tendinopathy and tenosynovitis, with some expansion of the tendon and accentuated internal signal. This corresponds to a marker indicating the location of the patient's pain.  Anterior:  Unremarkable  Achilles: Unremarkable  Plantar Fascia: Borderline thickened medial band of the plantar fascia proximally as on image 11 series 5, adjacent to a plantar calcaneal spur.  LIGAMENTS  Lateral: Unremarkable  Medial: Accentuated signal in the deep tibiotalar portion of the deltoid ligament on images 20-22 of series 7 potentially from sprain. Thickened superomedial portion of the spring ligament and slightly indistinct tibiospring portion of the deltoid ligament.  CARTILAGE  Ankle Joint: 0.4 cm non-fragmented osteochondral lesion of the medial talar dome on image 22 of series 7.  Subtalar Joints/Sinus Tarsi: There is edema in the talar neck underlying a potential osteochondral lesion on image 12 of series 6. This does not have the sharply defined appearance of a vascular remnant which can also appear in this region.  Bones: Degenerative subcortical cystic lesion along the distal calcaneus near the articulation with the cuboid.  Other: No supplemental non-categorized findings.  IMPRESSION: 1. Substantial tibialis posterior tendinopathy and tenosynovitis, with some expansion of the tendon and accentuated internal signal. This corresponds to a marker indicating the location of the patient's pain. 2. Accentuated signal in the deep tibiotalar portion of the deltoid ligament potentially from sprain. There is also thickening of the superomedial portion of the spring ligament and slightly indistinct tibiospring portion of the deltoid ligament. 3. Mild common peroneal tendon sheath tenosynovitis adjacent to the cuboid. 4. Borderline thickened medial band of the plantar fascia proximally adjacent to a plantar calcaneal spur. 5. 0.4 cm non-fragmented osteochondral lesion of the medial talar dome. 6. Degenerative subcortical cystic lesion along the distal calcaneus near the articulation  with the cuboid. 7. Edema in the neck of the calcaneus, and a more  infiltrative pattern than would typically seen in the setting of a vascular remnant can also occur in this location, probably due to a local osteochondral lesion along the subtalar joint.   Electronically Signed   By: Van Clines M.D.   On: 01/20/2021 08:40    Assessment:   1. PTTD (posterior tibial tendon dysfunction)   2. Posterior tibial tendinitis of left lower extremity   3. Pes planus of both feet   4. Gastrocnemius equinus of left lower extremity   5. Sinus tarsi syndrome of left ankle   6. Osteochondral defect of talus   7. Tear of deltoid ligament of left ankle, initial encounter   8. Tear of left plantar calcaneonavicular ligament, initial encounter      Plan:  Patient was evaluated and treated and all questions answered.   I met with the patient and after examining her discuss the radiographic and MRI findings with her and her daughter in detail.  I discussed the etiology, pathomechanics and biomechanics of adult acquired flatfoot disease as well as the advancement of this into hindfoot and midfoot arthritis.  We discussed both surgical and nonsurgical treatment options including osteotomy, arthrodesis, tendon repair tendon transfer as well as nonoperative treatment with bracing and/or injections.  She has not completed physical therapy and I think this would be a good starting point for her to see if she can strengthen the tendon.  She is able to with difficulty doing bilateral heel rise but is unable to do with a single heel rise.  I discussed with her that her MRI is concerning for cystic degenerative changes I think it is prudent that we evaluate whether these joints are contributing to her pain has an arthritic process.  This should help [place] to elucidate whether osteotomy and joint sparing procedures would be successful versus proceeding with arthrodesis at her age.  Following sterile prep with Betadine and alcohol, 4 mg of dexamethasone and 10 mg of Kenalog was  injected into both the ankle and subtalar joints of the left lower extremity in 2 separate locations.  She tolerated it well and I dressed with a Band-Aid.  She should follow-up after the next few weeks of physical therapy and let me know how her joint injections helped or did not help.  Return in about 6 weeks (around 03/17/2021).

## 2021-03-18 IMAGING — MR MR ANKLE*L* W/O CM
4 of 5 series · 12 of 40 positions shown · non-contrast
Comparison: Radiographs 10/09/2020

CLINICAL DATA: Medial left ankle pain for 3 months

EXAM:
MRI OF THE LEFT ANKLE WITHOUT CONTRAST
TECHNIQUE: Multiplanar, multisequence MR imaging of the ankle was performed. No
intravenous contrast was administered.

[Series 3: PD fat-sat · axial · left · 3.0mm · 0.25mm/px · z∈[-82,+5]mm · 3 of 30 slices shown]
[im 4/30]
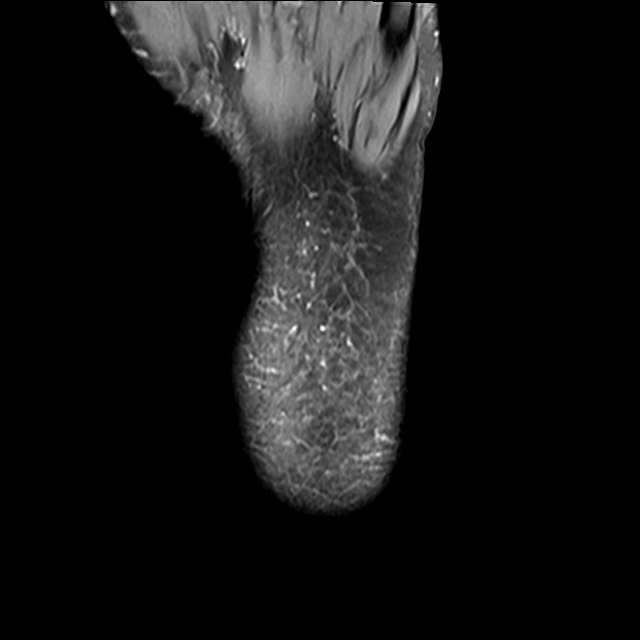
[im 15/30]
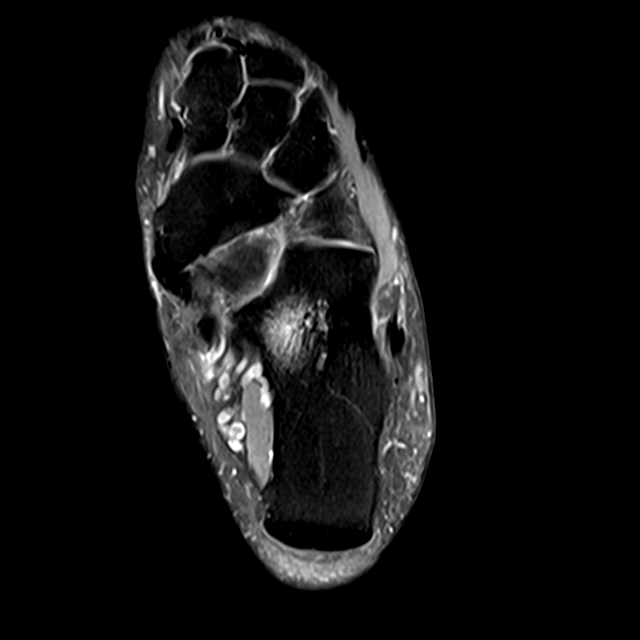
[im 26/30]
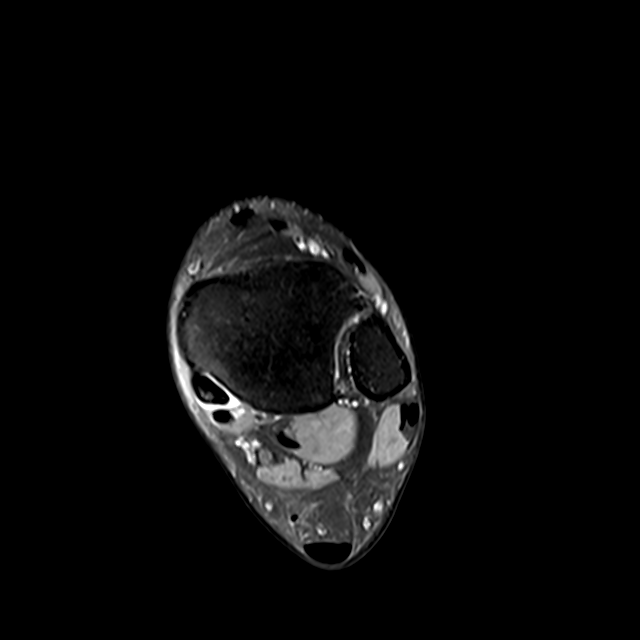

[Series 4: T2 fat-sat · axial · left · 3.0mm · 0.25mm/px · z∈[-78,+1]mm · 3 of 30 slices shown (1 of 2)]
[im 5/30]
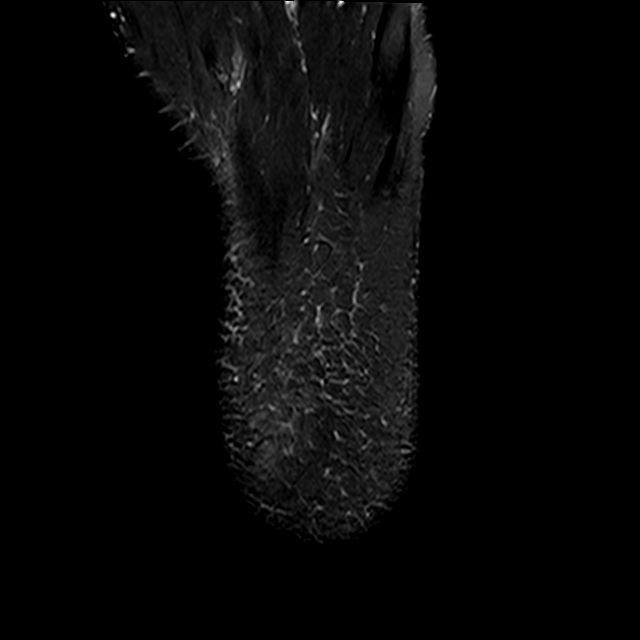
[im 17/30]
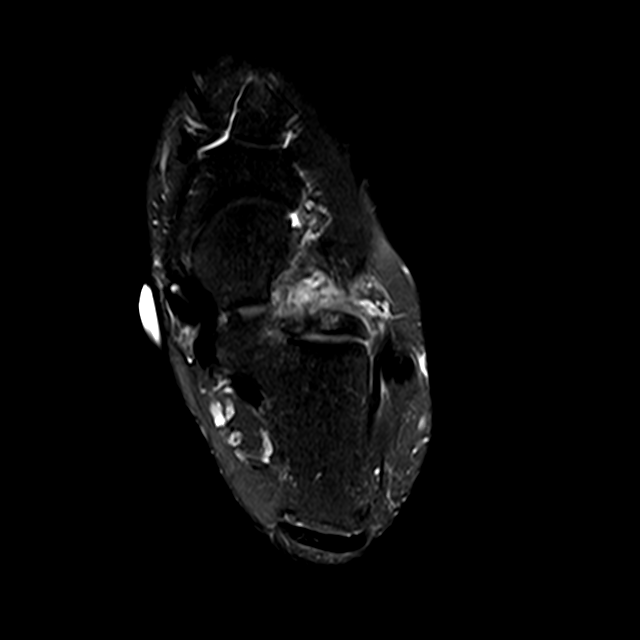
[im 25/30]
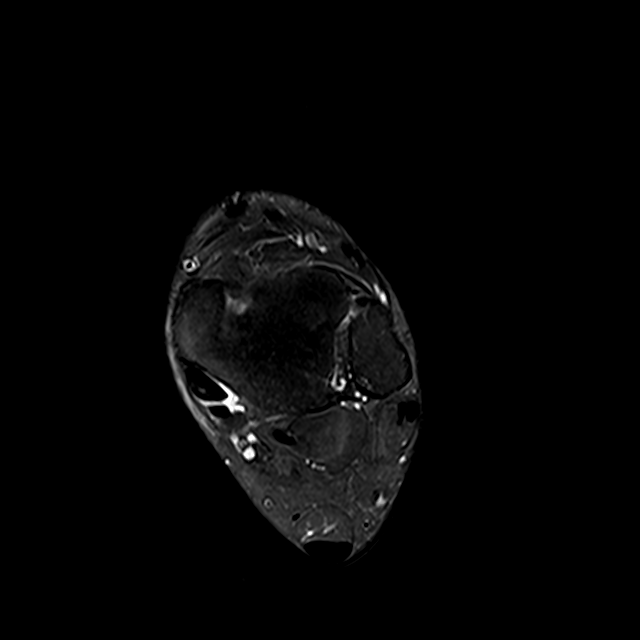

[Series 5: T1 · sagittal · left · 4.0mm · 0.27mm/px · 3 of 24 slices shown]
[im 5/24]
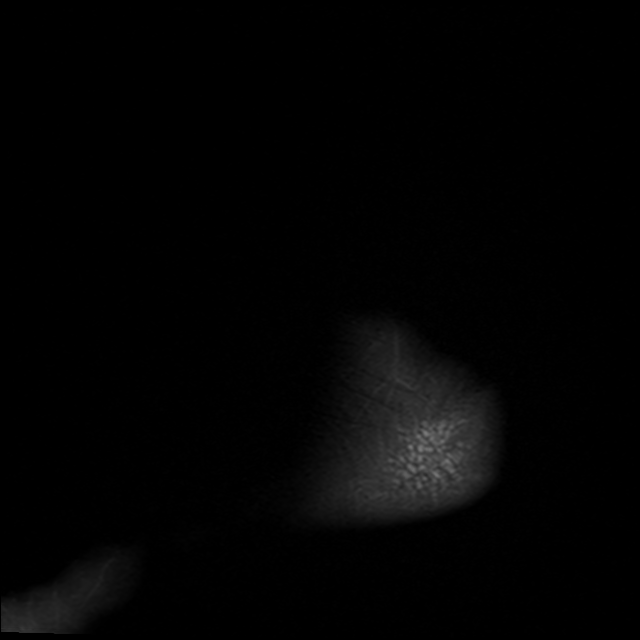
[im 14/24]
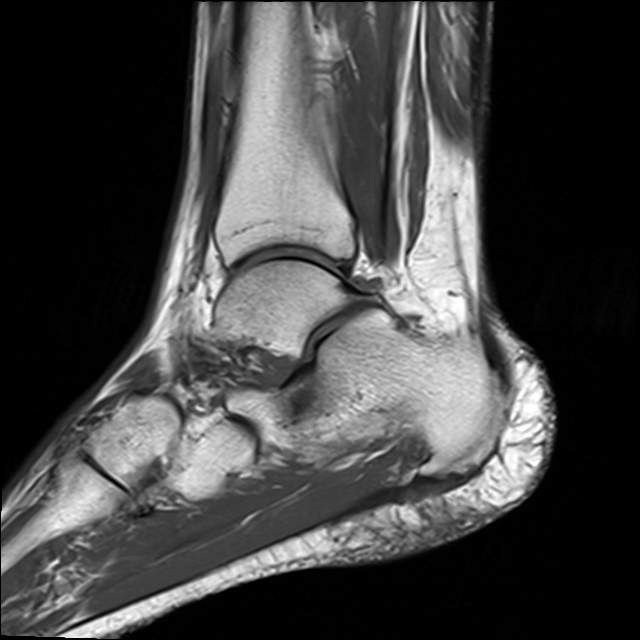
[im 24/24]
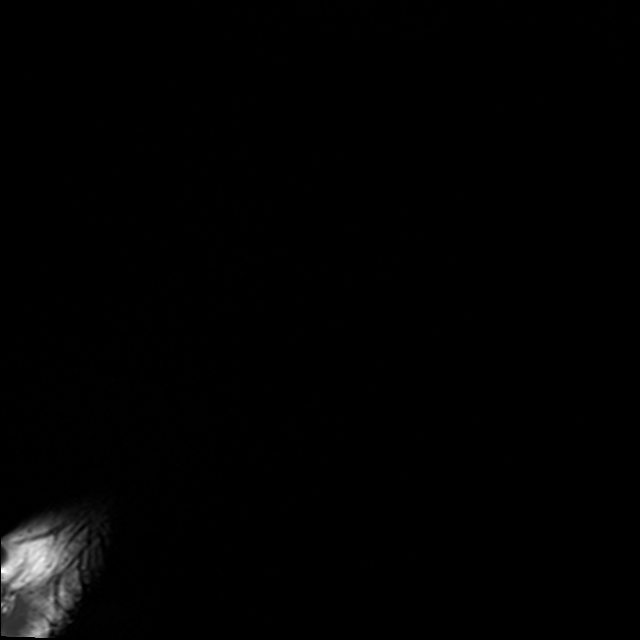

[Series 7: T2 fat-sat · coronal · left · 3.0mm · 0.25mm/px · 3 of 42 slices shown (2 of 2)]
[im 5/42]
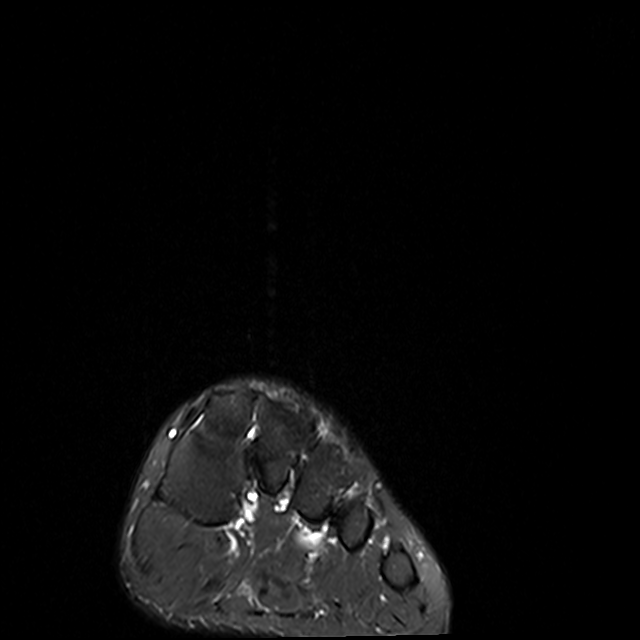
[im 21/42]
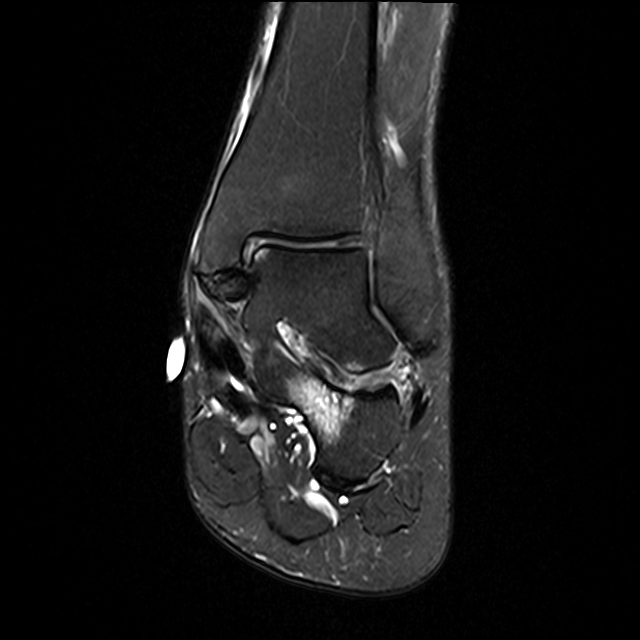
[im 37/42]
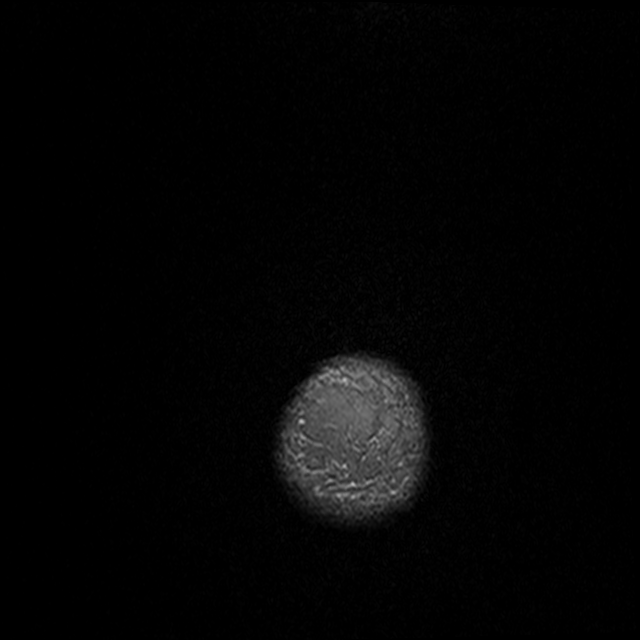

[12 of 40 positions shown; findings below may reference images not displayed]

FINDINGS: TENDONS

Peroneal: Mild common peroneal tendon sheath tenosynovitis. Mild
peroneus longus tenosynovitis adjacent to the cuboid.

Posteromedial: Substantial tibialis posterior tendinopathy and
tenosynovitis, with some expansion of the tendon and accentuated
internal signal. This corresponds to a marker indicating the
location of the patient's pain.

Anterior: Unremarkable

Achilles: Unremarkable

Plantar Fascia: Borderline thickened medial band of the plantar
fascia proximally as on image 11 series 5, adjacent to a plantar
calcaneal spur.

LIGAMENTS

Lateral: Unremarkable

Medial: Accentuated signal in the deep tibiotalar portion of the
deltoid ligament on images 20-22 of series 7 potentially from
sprain. Thickened superomedial portion of the spring ligament and
slightly indistinct tibiospring portion of the deltoid ligament.

CARTILAGE

Ankle Joint: 0.4 cm non-fragmented osteochondral lesion of the
medial talar dome on image 22 of series 7.

Subtalar Joints/Sinus Tarsi: There is edema in the talar neck
underlying a potential osteochondral lesion on image 12 of series 6.
This does not have the sharply defined appearance of a vascular
remnant which can also appear in this region.

Bones: Degenerative subcortical cystic lesion along the distal
calcaneus near the articulation with the cuboid.

Other: No supplemental non-categorized findings.
IMPRESSION: 1. Substantial tibialis posterior tendinopathy and tenosynovitis,
with some expansion of the tendon and accentuated internal signal.
This corresponds to a marker indicating the location of the
patient's pain.
2. Accentuated signal in the deep tibiotalar portion of the deltoid
ligament potentially from sprain. There is also thickening of the
superomedial portion of the spring ligament and slightly indistinct
tibiospring portion of the deltoid ligament.
3. Mild common peroneal tendon sheath tenosynovitis adjacent to the
cuboid.
4. Borderline thickened medial band of the plantar fascia proximally
adjacent to a plantar calcaneal spur.
5. 0.4 cm non-fragmented osteochondral lesion of the medial talar
dome.
6. Degenerative subcortical cystic lesion along the distal calcaneus
near the articulation with the cuboid.
7. Edema in the neck of the calcaneus, and a more infiltrative
pattern than would typically seen in the setting of a vascular
remnant can also occur in this location, probably due to a local
osteochondral lesion along the subtalar joint.

## 2021-04-15 ENCOUNTER — Ambulatory Visit: Payer: No Typology Code available for payment source | Admitting: Podiatry

## 2021-05-12 ENCOUNTER — Ambulatory Visit: Payer: No Typology Code available for payment source | Admitting: Podiatry

## 2021-05-12 ENCOUNTER — Other Ambulatory Visit: Payer: Self-pay

## 2021-05-12 ENCOUNTER — Encounter: Payer: Self-pay | Admitting: Podiatry

## 2021-05-12 DIAGNOSIS — M2141 Flat foot [pes planus] (acquired), right foot: Secondary | ICD-10-CM

## 2021-05-12 DIAGNOSIS — M216X2 Other acquired deformities of left foot: Secondary | ICD-10-CM

## 2021-05-12 DIAGNOSIS — M76829 Posterior tibial tendinitis, unspecified leg: Secondary | ICD-10-CM

## 2021-05-12 DIAGNOSIS — M2142 Flat foot [pes planus] (acquired), left foot: Secondary | ICD-10-CM

## 2021-05-12 DIAGNOSIS — M25572 Pain in left ankle and joints of left foot: Secondary | ICD-10-CM | POA: Diagnosis not present

## 2021-05-12 DIAGNOSIS — M21862 Other specified acquired deformities of left lower leg: Secondary | ICD-10-CM

## 2021-05-12 NOTE — Progress Notes (Signed)
Subjective:  Patient ID: Megan Villegas, female    DOB: 1966-07-30,  MRN: [id number]  Chief Complaint  Patient presents with  . Tendonitis    left tendon/ankle pain, f/u     55 y.o. female returns for follow-up with the above complaint. History confirmed with patient.  She has been doing physical therapy which is helped quite a bit.  She has no medial pain now.  Only some lateral in the anterolateral ankle.  Objective:  Physical Exam: warm, good capillary refill, no trophic changes or ulcerative lesions, normal DP and PT pulses and normal sensory exam. Left Foot: Overall pain improved significantly, she has mild pain in the sinus tarsi.  No pain medially and good 5 out of 5 strength along the PT tendon now  Radiographs: X-ray of the left foot: New oblique and calcaneal axial views taken she has hindfoot valgus  Study Result  Narrative & Impression  CLINICAL DATA:  Medial left ankle pain for 3 months  EXAM: MRI OF THE LEFT ANKLE WITHOUT CONTRAST  TECHNIQUE: Multiplanar, multisequence MR imaging of the ankle was performed. No intravenous contrast was administered.  COMPARISON:  Radiographs 10/09/2020  FINDINGS: TENDONS  Peroneal: Mild common peroneal tendon sheath tenosynovitis. Mild peroneus longus tenosynovitis adjacent to the cuboid.  Posteromedial: Substantial tibialis posterior tendinopathy and tenosynovitis, with some expansion of the tendon and accentuated internal signal. This corresponds to a marker indicating the location of the patient's pain.  Anterior: Unremarkable  Achilles: Unremarkable  Plantar Fascia: Borderline thickened medial band of the plantar fascia proximally as on image 11 series 5, adjacent to a plantar calcaneal spur.  LIGAMENTS  Lateral: Unremarkable  Medial: Accentuated signal in the deep tibiotalar portion of the deltoid ligament on images 20-22 of series 7 potentially from sprain. Thickened superomedial portion of the  spring ligament and slightly indistinct tibiospring portion of the deltoid ligament.  CARTILAGE  Ankle Joint: 0.4 cm non-fragmented osteochondral lesion of the medial talar dome on image 22 of series 7.  Subtalar Joints/Sinus Tarsi: There is edema in the talar neck underlying a potential osteochondral lesion on image 12 of series 6. This does not have the sharply defined appearance of a vascular remnant which can also appear in this region.  Bones: Degenerative subcortical cystic lesion along the distal calcaneus near the articulation with the cuboid.  Other: No supplemental non-categorized findings.  IMPRESSION: 1. Substantial tibialis posterior tendinopathy and tenosynovitis, with some expansion of the tendon and accentuated internal signal. This corresponds to a marker indicating the location of the patient's pain. 2. Accentuated signal in the deep tibiotalar portion of the deltoid ligament potentially from sprain. There is also thickening of the superomedial portion of the spring ligament and slightly indistinct tibiospring portion of the deltoid ligament. 3. Mild common peroneal tendon sheath tenosynovitis adjacent to the cuboid. 4. Borderline thickened medial band of the plantar fascia proximally adjacent to a plantar calcaneal spur. 5. 0.4 cm non-fragmented osteochondral lesion of the medial talar dome. 6. Degenerative subcortical cystic lesion along the distal calcaneus near the articulation with the cuboid. 7. Edema in the neck of the calcaneus, and a more infiltrative pattern than would typically seen in the setting of a vascular remnant can also occur in this location, probably due to a local osteochondral lesion along the subtalar joint.   Electronically Signed   By: Gaylyn Rong M.D.   On: 01/20/2021 08:40    Assessment:   1. PTTD (posterior tibial tendon dysfunction)   2. Pes planus  of both feet   3. Gastrocnemius equinus of left lower  extremity   4. Sinus tarsi syndrome of left ankle      Plan:  Patient was evaluated and treated and all questions answered.  Overall she is had significant improvement with physical therapy.  Hopefully this will allow her to avoid surgery.  We discussed further support with orthotics or bracing.  I recommended custom molded orthoses, she has tried prefabricated orthoses without much success.  She was casted for these today.  We will fashion these with a longitudinal arch support and a semirigid support with a deep heel cup to support the medial arch and prevent overpronation.  She return for follow-up after these are made and she has had some time to break them and to see if they need adjustments or if worsens before then.  The injection was not very helpful last time  No follow-ups on file.

## 2021-05-28 ENCOUNTER — Other Ambulatory Visit: Payer: Self-pay

## 2021-05-28 ENCOUNTER — Ambulatory Visit (INDEPENDENT_AMBULATORY_CARE_PROVIDER_SITE_OTHER): Payer: No Typology Code available for payment source | Admitting: Podiatry

## 2021-05-28 DIAGNOSIS — M76829 Posterior tibial tendinitis, unspecified leg: Secondary | ICD-10-CM

## 2021-05-28 DIAGNOSIS — M2141 Flat foot [pes planus] (acquired), right foot: Secondary | ICD-10-CM

## 2021-05-28 DIAGNOSIS — M2142 Flat foot [pes planus] (acquired), left foot: Secondary | ICD-10-CM

## 2021-05-28 NOTE — Patient Instructions (Signed)

## 2021-05-28 NOTE — Progress Notes (Signed)
Patient presents today for orthotic pick up. Patient voices no new complaints.  Orthotics were fitted to patient's feet. No discomfort and no rubbing. Patient satisfied with the orthotics.  Orthotics were dispensed to patient with instructions for break in wear and to call the office with any concerns or questions.
